# Patient Record
Sex: Female | Born: 1995 | Race: Black or African American | Hispanic: No | Marital: Single | Smoking: Never smoker
Health system: Southern US, Community
[De-identification: ages and names within clinical notes are randomized; demographics above are authoritative.]

## PROBLEM LIST (undated history)

## (undated) DIAGNOSIS — Z789 Other specified health status: Secondary | ICD-10-CM

## (undated) DIAGNOSIS — O1490 Unspecified pre-eclampsia, unspecified trimester: Secondary | ICD-10-CM

## (undated) HISTORY — PX: NO PAST SURGERIES: SHX2092

---
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
## 2015-06-22 ENCOUNTER — Emergency Department (HOSPITAL_COMMUNITY)
Admission: EM | Admit: 2015-06-22 | Discharge: 2015-06-22 | Disposition: A | Discharge status: Home / Self Care | Payer: BLUE CROSS/BLUE SHIELD | Attending: Emergency Medicine | Admitting: Emergency Medicine

## 2015-06-22 ENCOUNTER — Encounter (HOSPITAL_COMMUNITY): Payer: Self-pay | Admitting: Oncology

## 2015-06-22 DIAGNOSIS — L03116 Cellulitis of left lower limb: Secondary | ICD-10-CM | POA: Insufficient documentation

## 2015-06-22 DIAGNOSIS — R2242 Localized swelling, mass and lump, left lower limb: Secondary | ICD-10-CM | POA: Diagnosis present

## 2015-06-22 MED ORDER — DOXYCYCLINE HYCLATE 100 MG PO CAPS: 100.0000 mg | ORAL_CAPSULE | Freq: Two times a day (BID) | ORAL | Status: DC

## 2015-06-22 MED ORDER — CEPHALEXIN 500 MG PO CAPS: 500.0000 mg | ORAL_CAPSULE | Freq: Four times a day (QID) | ORAL | Status: DC

## 2015-06-22 NOTE — ED Notes (Signed)
Pt was in [place] and got a fire ant bite was seen there and given 2 shots.  Pt has an open area on left medial calf w/ purulent drainage site is warm and red.  Pain is 10/10.

## 2015-06-22 NOTE — ED Provider Notes (Signed)
CSN: [id number]   Arrival date & time 06/22/15 0004  History  This chart was scribed for  Derwood Kaplan, MD by Bethel Born, ED Scribe. This patient was seen in room WA25/WA25 and the patient's care was started at 2:28 AM.  Chief Complaint  Patient presents with  . Insect Bite    HPI The history is provided by the patient and a parent. No language interpreter was used.   Marisa Mueller is a 19 y.o. female who presents to the Emergency Department complaining of a suspected insect bite at the left calf with onset 3-4 weeks ago. She believes that she was bitten by a fire ant when she visited [place] the 2 week in July but is not sure. There was a bump that she tried to open by squeezing. At that time she was treated in the ED and given prescriptions that she was unable to fill. Since then the swelling has worsened, the pain has increased, and she has noted some yellow/purulent drainage after squeezing the area. No fever or chills.    History reviewed. No pertinent past medical history.  History reviewed. No pertinent past surgical history.  No family history on file.  Social History  Substance Use Topics  . Smoking status: Passive Smoke Exposure - Never Smoker  . Smokeless tobacco: None  . Alcohol Use: No     Review of Systems  Constitutional: Negative for fever and chills.  Musculoskeletal:       An area of pain and swelling at the left leg with purulent drainage     Home Medications   Prior to Admission medications   Medication Sig Start Date End Date Taking? Authorizing Provider  acetaminophen (TYLENOL) 500 MG tablet Take 1,000 mg by mouth every 6 (six) hours as needed for headache.   Yes Historical Provider, MD  cephALEXin (KEFLEX) 500 MG capsule Take 1 capsule (500 mg total) by mouth 4 (four) times daily. 06/22/15   Derwood Kaplan, MD  doxycycline (VIBRAMYCIN) 100 MG capsule Take 1 capsule (100 mg total) by mouth 2 (two) times daily. 06/22/15   Derwood Kaplan, MD     Allergies  Review of patient's allergies indicates no known allergies.  Triage Vitals: BP 136/79 mmHg  Pulse 100  Temp(Src) 99.4 F (37.4 C) (Oral)  Resp 18  Ht  (1.575 m)  Wt 107 lb (48.535 kg)  BMI 19.57 kg/m2  SpO2 100%  LMP 06/03/2015  Physical Exam  Constitutional: She is oriented to person, place, and time. She appears well-developed and well-nourished.  HENT:  Head: Normocephalic.  Eyes: EOM are normal.  Neck: Normal range of motion.  Pulmonary/Chest: Effort normal.  Abdominal: She exhibits no distension.  Musculoskeletal: Normal range of motion.  Left leg- 4 cm area of erythematous and edematous lesion with central crusting and +blanching No abscess drainage to palpation +Mild fluctuance  Neurological: She is alert and oriented to person, place, and time.  Psychiatric: She has a normal mood and affect.  Nursing note and vitals reviewed.   ED Course  Procedures   DIAGNOSTIC STUDIES: Oxygen Saturation is 100% on RA, normal by my interpretation.    COORDINATION OF CARE: 2:33 AM Discussed treatment plan with pt at bedside and pt agreed to plan.  Labs Review- Labs Reviewed - No data to display  Imaging Review No results found.  EKG Interpretation None      MDM   Final diagnoses:  Cellulitis of left lower extremity     I personally performed  the services described in this documentation, which was scribed in my presence. The recorded information has been reviewed and is accurate.  Pt has a lesion that appears to be infected. She had drained the abscess herself - and i dont have a noticeable area of fluctuance that needs to be drained. Will start her on antibiotics.    Derwood Kaplan, MD 06/22/15 [phone]

## 2015-06-22 NOTE — Discharge Instructions (Signed)

## 2015-09-08 ENCOUNTER — Encounter (HOSPITAL_BASED_OUTPATIENT_CLINIC_OR_DEPARTMENT_OTHER): Payer: Self-pay | Admitting: *Deleted

## 2015-09-08 ENCOUNTER — Emergency Department (HOSPITAL_BASED_OUTPATIENT_CLINIC_OR_DEPARTMENT_OTHER)
Admission: EM | Admit: 2015-09-08 | Discharge: 2015-09-08 | Disposition: A | Discharge status: Home / Self Care | Payer: BLUE CROSS/BLUE SHIELD | Attending: Emergency Medicine | Admitting: Emergency Medicine

## 2015-09-08 ENCOUNTER — Emergency Department (HOSPITAL_BASED_OUTPATIENT_CLINIC_OR_DEPARTMENT_OTHER): Payer: BLUE CROSS/BLUE SHIELD

## 2015-09-08 DIAGNOSIS — O9989 Other specified diseases and conditions complicating pregnancy, childbirth and the puerperium: Secondary | ICD-10-CM | POA: Diagnosis present

## 2015-09-08 DIAGNOSIS — N390 Urinary tract infection, site not specified: Secondary | ICD-10-CM

## 2015-09-08 DIAGNOSIS — Z3A01 Less than 8 weeks gestation of pregnancy: Secondary | ICD-10-CM | POA: Insufficient documentation

## 2015-09-08 DIAGNOSIS — O26891 Other specified pregnancy related conditions, first trimester: Secondary | ICD-10-CM

## 2015-09-08 DIAGNOSIS — O2341 Unspecified infection of urinary tract in pregnancy, first trimester: Secondary | ICD-10-CM | POA: Insufficient documentation

## 2015-09-08 DIAGNOSIS — R109 Unspecified abdominal pain: Secondary | ICD-10-CM

## 2015-09-08 DIAGNOSIS — Z3490 Encounter for supervision of normal pregnancy, unspecified, unspecified trimester: Secondary | ICD-10-CM

## 2015-09-08 DIAGNOSIS — R102 Pelvic and perineal pain: Secondary | ICD-10-CM

## 2015-09-08 LAB — URINALYSIS, ROUTINE W REFLEX MICROSCOPIC
Bilirubin Urine: NEGATIVE
GLUCOSE, UA: NEGATIVE mg/dL
Ketones, ur: >80 mg/dL — AB
Nitrite: POSITIVE — AB
PH: 6.5 (ref 5.0–8.0)
Protein, ur: NEGATIVE mg/dL
Specific Gravity, Urine: 1.025 (ref 1.005–1.030)
Urobilinogen, UA: 1 mg/dL (ref 0.0–1.0)

## 2015-09-08 LAB — URINE MICROSCOPIC-ADD ON

## 2015-09-08 LAB — HCG, QUANTITATIVE, PREGNANCY: HCG, BETA CHAIN, QUANT, S: 88660 m[IU]/mL — AB (ref <5)

## 2015-09-08 LAB — ABO/RH: ABO/RH(D): O POS

## 2015-09-08 LAB — PREGNANCY, URINE: Preg Test, Ur: POSITIVE — AB

## 2015-09-08 MED ORDER — CEPHALEXIN 500 MG PO CAPS: 500.0000 mg | ORAL_CAPSULE | Freq: Four times a day (QID) | ORAL | Status: DC

## 2015-09-08 MED ORDER — METOCLOPRAMIDE HCL 10 MG PO TABS: 10.0000 mg | ORAL_TABLET | Freq: Four times a day (QID) | ORAL | Status: AC

## 2015-09-08 MED ORDER — SODIUM CHLORIDE 0.9 % IV BOLUS (SEPSIS)
1000.0000 mL | Freq: Once | INTRAVENOUS | Status: AC
  Administered 2015-09-08: 1000 mL via INTRAVENOUS

## 2015-09-08 NOTE — Discharge Instructions (Signed)
You were evaluated in the ED today for your abdominal discomfort. You were found to have a UTI and you are also pregnant. Your ultrasound showed approximately 266 week old gestation Please follow-up with women's center and your primary care doctor for definitive care. Please take her antibiotic as prescribed to help with your urinary tract infection. You may take your Reglan for nausea. Return to ED for worsening symptoms.  Abdominal Pain During Pregnancy Belly (abdominal) pain is common during pregnancy. Most of the time, it is not a serious problem. Other times, it can be a sign that something is wrong with the pregnancy. Always tell your doctor if you have belly pain. HOME CARE Monitor your belly pain for any changes. The following actions may help you feel better:  Do not have sex (intercourse) or put anything in your vagina until you feel better.  Rest until your pain stops.  Drink clear fluids if you feel sick to your stomach (nauseous). Do not eat solid food until you feel better.  Only take medicine as told by your doctor.  Keep all doctor visits as told. GET HELP RIGHT AWAY IF:   You are bleeding, leaking fluid, or pieces of tissue come out of your vagina.  You have more pain or cramping.  You keep throwing up (vomiting).  You have pain when you pee (urinate) or have blood in your pee.  You have a fever.  You do not feel your baby moving as much.  You feel very weak or feel like passing out.  You have trouble breathing, with or without belly pain.  You have a very bad headache and belly pain.  You have fluid leaking from your vagina and belly pain.  You keep having watery poop (diarrhea).  Your belly pain does not go away after resting, or the pain gets worse. MAKE SURE YOU:   Understand these instructions.  Will watch your condition.  Will get help right away if you are not doing well or get worse.   This information is not intended to replace advice given to  you by your health care provider. Make sure you discuss any questions you have with your health care provider.   Document Released: 10/13/2009 Document Revised: 06/27/2013 Document Reviewed: 05/24/2013 Elsevier Interactive Patient Education [employer]2016 Elsevier Inc.

## 2015-09-08 NOTE — ED Notes (Addendum)
Pt c/o lower abd pain x 1 week with nausea, positive preg

## 2015-09-08 NOTE — ED Provider Notes (Signed)
CSN: [id number]     Arrival date & time 09/08/15  1608 History   First MD Initiated Contact with Patient 09/08/15 1625     Chief Complaint  Patient presents with  . Abdominal Pain     (Consider location/radiation/quality/duration/timing/severity/associated sxs/prior Treatment) HPI Marisa Mueller is a 19 y.o. female who comes in for evaluation of lower abdominal discomfort. Patient states she has had lower abdominal, frontal cramping since Monday. She reports taking 3 home pregnancy tests at home which were all positive. She is accompanied by family at bedside who states she is here today for proof of pregnancy for her to be seen at health department/Medicaid office. They also expressed concern for possible ectopic. Patient has had no prior pregnancies. She reports associated nausea and vomiting, nonbloody and nonbilious. She reports her last menstrual period was last month in the middle of the month but is unsure exactly time. She reports associated discharge for the past 2 weeks but no vaginal bleeding. She reports decreased urine frequency since Monday. No other back pain, fevers or chills. No other abdominal surgeries. Denies diarrhea or constipation. Rates her overall discomfort as 6/10. No other aggravating or modifying factors.  History reviewed. No pertinent past medical history. History reviewed. No pertinent past surgical history. History reviewed. No pertinent family history. Social History  Substance Use Topics  . Smoking status: Passive Smoke Exposure - Never Smoker  . Smokeless tobacco: None  . Alcohol Use: No   OB History    No data available     Review of Systems A 10 point review of systems was completed and was negative except for pertinent positives and negatives as mentioned in the history of present illness     Allergies  Review of patient's allergies indicates no known allergies.  Home Medications   Prior to Admission medications   Medication Sig Start Date End  Date Taking? Authorizing Provider  cephALEXin (KEFLEX) 500 MG capsule Take 1 capsule (500 mg total) by mouth 4 (four) times daily. 09/08/15   Joycie Peek, PA-C  metoCLOPramide (REGLAN) 10 MG tablet Take 1 tablet (10 mg total) by mouth every 6 (six) hours. 09/08/15   Ondra Deboard, PA-C   BP 107/55 mmHg  Pulse 93  Temp(Src) 98.6 F (37 C) (Oral)  Resp 16  Ht  (1.575 m)  Wt 107 lb (48.535 kg)  BMI 19.57 kg/m2  SpO2 100%  LMP 07/23/2015 Physical Exam  Constitutional: She is oriented to person, place, and time. She appears well-developed and well-nourished.  HENT:  Head: Normocephalic and atraumatic.  Mouth/Throat: Oropharynx is clear and moist.  Eyes: Conjunctivae are normal. Pupils are equal, round, and reactive to light. Right eye exhibits no discharge. Left eye exhibits no discharge. No scleral icterus.  Neck: Neck supple.  Cardiovascular: Normal rate, regular rhythm and normal heart sounds.   Pulmonary/Chest: Effort normal and breath sounds normal. No respiratory distress. She has no wheezes. She has no rales.  Abdominal: Soft. There is no tenderness.  Abdomen is soft, nondistended. Mild Tenderness in the superpubic region with palpation. No other focal abdominal tenderness. No rebound or guarding. No CVA tenderness. No other lesions or deformities.  Musculoskeletal: She exhibits no tenderness.  Neurological: She is alert and oriented to person, place, and time.  Cranial Nerves II-XII grossly intact  Skin: Skin is warm and dry. No rash noted.  Psychiatric: She has a normal mood and affect.  Nursing note and vitals reviewed.   ED Course  Procedures (including critical care  time) Labs Review Labs Reviewed  PREGNANCY, URINE - Abnormal; Notable for the following:    Preg Test, Ur POSITIVE (*)    All other components within normal limits  URINALYSIS, ROUTINE W REFLEX MICROSCOPIC (NOT AT [facility]) - Abnormal; Notable for the following:    Color, Urine AMBER (*)     APPearance CLOUDY (*)    Hgb urine dipstick TRACE (*)    Ketones, ur >80 (*)    Nitrite POSITIVE (*)    Leukocytes, UA LARGE (*)    All other components within normal limits  URINE MICROSCOPIC-ADD ON - Abnormal; Notable for the following:    Squamous Epithelial / LPF FEW (*)    Bacteria, UA MANY (*)    All other components within normal limits  HCG, QUANTITATIVE, PREGNANCY - Abnormal; Notable for the following:    hCG, Beta Chain, Sharene Butters, [place] [address] (*)    All other components within normal limits  URINE CULTURE  ABO/RH    Imaging Review US Ob Comp Less 14 Wks  09/08/2015  CLINICAL DATA:  Initial encounter for 1 week history of abdominal pain with positive pregnancy test. EXAM: OBSTETRIC <14 WK [place] AND TRANSVAGINAL OB US TECHNIQUE: Both transabdominal and transvaginal ultrasound examinations were performed for complete evaluation of the gestation as well as the maternal uterus, adnexal regions, and pelvic cul-de-sac. Transvaginal technique was performed to assess early pregnancy. COMPARISON:  None. FINDINGS: Intrauterine gestational sac: Visualized/normal in shape. Yolk sac:  Visualized Embryo:  Visualized Cardiac Activity: Visualized Heart Rate: 106  bpm CRL:  5  mm   6 w   2 d                  [place] EDC: 04/30/2015. Maternal uterus/adnexae: Maternal ovaries are normal in appearance. No evidence for subchorionic hemorrhage. No free fluid in the cul-de-sac. IMPRESSION: Single living intrauterine gestation at estimated 6 week 2 day gestational age by crown-rump length. Electronically Signed   By: Kennith Center M.D.   On: 09/08/2015 18:32   US Ob Transvaginal  09/08/2015  CLINICAL DATA:  Initial encounter for 1 week history of abdominal pain with positive pregnancy test. EXAM: OBSTETRIC <14 WK [place] AND TRANSVAGINAL OB US TECHNIQUE: Both transabdominal and transvaginal ultrasound examinations were performed for complete evaluation of the gestation as well as the maternal uterus, adnexal regions, and  pelvic cul-de-sac. Transvaginal technique was performed to assess early pregnancy. COMPARISON:  None. FINDINGS: Intrauterine gestational sac: Visualized/normal in shape. Yolk sac:  Visualized Embryo:  Visualized Cardiac Activity: Visualized Heart Rate: 106  bpm CRL:  5  mm   6 w   2 d                  [place] EDC: 04/30/2015. Maternal uterus/adnexae: Maternal ovaries are normal in appearance. No evidence for subchorionic hemorrhage. No free fluid in the cul-de-sac. IMPRESSION: Single living intrauterine gestation at estimated 6 week 2 day gestational age by crown-rump length. Electronically Signed   By: Kennith Center M.D.   On: 09/08/2015 18:32   I have personally reviewed and evaluated these images and lab results as part of my medical decision-making.   EKG Interpretation None     Meds given in ED:  Medications  sodium chloride 0.9 % bolus 1,000 mL (0 mLs Intravenous Stopped 09/08/15 1846)    Discharge Medication List as of 09/08/2015  8:34 PM    START taking these medications   Details  cephALEXin (KEFLEX) 500 MG capsule Take 1 capsule (  500 mg total) by mouth 4 (four) times daily., Starting 09/08/2015, Until Discontinued, Print    metoCLOPramide (REGLAN) 10 MG tablet Take 1 tablet (10 mg total) by mouth every 6 (six) hours., Starting 09/08/2015, Until Discontinued, Print       Filed Vitals:   09/08/15 1611 09/08/15 1855 09/08/15 2126  BP: 124/72 95/54 107/55  Pulse: 79 89 93  Temp: 98.3 F (36.8 C) 98.9 F (37.2 C) 98.6 F (37 C)  TempSrc: Oral Oral Oral  Resp: 18 18 16   Height: 5\' 2"  (1.575 m)    Weight: 107 lb (48.535 kg)    SpO2: 94% 100% 100%    MDM  Stasia CavalierSabiha Welton FlakesKhan is a 19 y.o. female G1 P0, comes in for evaluation of abdominal cramping. Patient found to be pregnant with single IUP via ultrasound. No evidence of ectopic. Also found to have UTI. Will obtain urine culture and treat with Keflex. Positive urine pregnancy test, beta hCG 80,000, ABO/Rh obtained-O+. Abdominal exam is  not impressive, very mild tenderness over suprapubic region consistent with UTI. Abdomen is otherwise benign. Patient given specific follow-up instructions and for definitive prenatal care with women's center. Also given referral for primary care. I discussed all imaging and labs with patient and was available for any and all questioning. Patient expresses none. Overall, patient appears well, nontoxic, is hemodynamically stable with normal vital signs and is appropriate for discharge. No evidence of other acute or emergent pathology at this time. Final diagnoses:  Abdominal discomfort  UTI (lower urinary tract infection)  Pregnancy        Joycie PeekBenjamin Jakorey Mcconathy, PA-C 09/09/15 1508  Tilden FossaElizabeth Rees, MD 09/18/15 [phone]

## 2015-09-08 NOTE — ED Notes (Signed)
pts boyfriends mother states she broughther here for a piece of paper that shows positive preg so she can get her medicaid and is concern for etopic preg.

## 2015-09-08 NOTE — ED Notes (Signed)
Pa  at bedside. 

## 2015-09-11 LAB — URINE CULTURE

## 2015-09-12 ENCOUNTER — Telehealth (HOSPITAL_COMMUNITY): Payer: Self-pay

## 2015-09-12 NOTE — Telephone Encounter (Signed)
Post ED Visit - Positive Culture Follow-up  Culture report reviewed by antimicrobial stewardship pharmacist:  []  Enzo BiNathan Batchelder, Pharm.D. []  Celedonio MiyamotoJeremy Frens, Pharm.D., BCPS []  Garvin FilaMike Maccia, Pharm.D. []  Georgina PillionElizabeth Martin, Pharm.D., BCPS []  LebanonMinh Pham, [address]Ph[address]D., BCPS, AAHIVP []  Estella HuskMichelle Turner, Pharm.D., BCPS, AAHIVP []  Tennis Mustassie Stewart, Pharm.D. [x]  Sherle Poeob Vincent, [address]Pharm[address]  Positive urine culture, >/= 100,000 colonies -> E Coli Treated with Cephalexin, organism sensitive to the same and no further patient follow-up is required at this time.  Marisa Mueller, Marisa Mueller 09/12/2015, 9:36 AM

## 2015-10-17 ENCOUNTER — Inpatient Hospital Stay (HOSPITAL_COMMUNITY)
Admission: AD | Admit: 2015-10-17 | Discharge: 2015-10-18 | Disposition: A | Discharge status: Home / Self Care | Payer: BLUE CROSS/BLUE SHIELD | Source: Ambulatory Visit | Attending: Obstetrics & Gynecology | Admitting: Obstetrics & Gynecology

## 2015-10-17 ENCOUNTER — Encounter (HOSPITAL_COMMUNITY): Payer: Self-pay | Admitting: *Deleted

## 2015-10-17 DIAGNOSIS — M545 Low back pain: Secondary | ICD-10-CM | POA: Insufficient documentation

## 2015-10-17 DIAGNOSIS — O26891 Other specified pregnancy related conditions, first trimester: Secondary | ICD-10-CM | POA: Diagnosis not present

## 2015-10-17 DIAGNOSIS — M549 Dorsalgia, unspecified: Secondary | ICD-10-CM | POA: Diagnosis not present

## 2015-10-17 DIAGNOSIS — Z3A11 11 weeks gestation of pregnancy: Secondary | ICD-10-CM | POA: Insufficient documentation

## 2015-10-17 DIAGNOSIS — O99891 Other specified diseases and conditions complicating pregnancy: Secondary | ICD-10-CM

## 2015-10-17 DIAGNOSIS — O9989 Other specified diseases and conditions complicating pregnancy, childbirth and the puerperium: Secondary | ICD-10-CM

## 2015-10-17 DIAGNOSIS — O2341 Unspecified infection of urinary tract in pregnancy, first trimester: Secondary | ICD-10-CM | POA: Insufficient documentation

## 2015-10-17 HISTORY — DX: Other specified health status: Z78.9

## 2015-10-17 LAB — URINALYSIS, ROUTINE W REFLEX MICROSCOPIC
BILIRUBIN URINE: NEGATIVE
Glucose, UA: NEGATIVE mg/dL
Hgb urine dipstick: NEGATIVE
KETONES UR: NEGATIVE mg/dL
Nitrite: NEGATIVE
Protein, ur: NEGATIVE mg/dL
Specific Gravity, Urine: 1.01 (ref 1.005–1.030)
pH: 7 (ref 5.0–8.0)

## 2015-10-17 LAB — URINE MICROSCOPIC-ADD ON

## 2015-10-17 NOTE — MAU Provider Note (Signed)
History     CSN: [id number]  Arrival date and time: 10/17/15 2222   First Provider Initiated Contact with Patient 10/17/15 2351      Chief Complaint  Patient presents with  . Back Pain   HPI   [name] is a 19 y.o. female G1P0 at [redacted]w[redacted]d presenting to MAU with complaints of lower back pain, both sides. The pain started it 1 week ago; the pain worsened today. She has never had this pain before. The pain comes and goes and worsens when she walks. She is on her feet a lot for school, however she does get rests to sit. She feels she is wearing a good pair of shoes.   She has not injured her back.   Denies vaginal bleeding.   She is scheduled to see [facility] on Tuesday for her first OB visit.   OB History    Gravida Para Term Preterm AB TAB SAB Ectopic Multiple Living   1         0      Past Medical History  Diagnosis Date  . Medical history non-contributory     Past Surgical History  Procedure Laterality Date  . No past surgeries      Family History  Problem Relation Age of Onset  . Cancer Paternal Grandmother   . Cancer Paternal Grandfather     Social History  Substance Use Topics  . Smoking status: Passive Smoke Exposure - Never Smoker  . Smokeless tobacco: None  . Alcohol Use: No    Allergies: No Known Allergies  Prescriptions prior to admission  Medication Sig Dispense Refill Last Dose  . cephALEXin (KEFLEX) 500 MG capsule Take 1 capsule (500 mg total) by mouth 4 (four) times daily. 40 capsule 0   . metoCLOPramide (REGLAN) 10 MG tablet Take 1 tablet (10 mg total) by mouth every 6 (six) hours. 30 tablet 0    Results for orders placed or performed during the hospital encounter of 10/17/15 (from the past 48 hour(s))  Urinalysis, Routine w reflex microscopic (not at [facility])     Status: Abnormal   Collection Time: 10/17/15 11:05 PM  Result Value Ref Range   Color, Urine YELLOW YELLOW   APPearance CLEAR CLEAR   Specific Gravity, Urine 1.010  1.005 - 1.030   pH 7.0 5.0 - 8.0   Glucose, UA NEGATIVE NEGATIVE mg/dL   Hgb urine dipstick NEGATIVE NEGATIVE   Bilirubin Urine NEGATIVE NEGATIVE   Ketones, ur NEGATIVE NEGATIVE mg/dL   Protein, ur NEGATIVE NEGATIVE mg/dL   Nitrite NEGATIVE NEGATIVE   Leukocytes, UA TRACE (A) NEGATIVE  Urine microscopic-add on     Status: Abnormal   Collection Time: 10/17/15 11:05 PM  Result Value Ref Range   Squamous Epithelial / LPF 0-5 (A) NONE SEEN   WBC, UA 0-5 0 - 5 WBC/hpf   RBC / HPF 0-5 0 - 5 RBC/hpf   Bacteria, UA FEW (A) NONE SEEN    Review of Systems  Constitutional: Positive for chills. Negative for fever.  Gastrointestinal: Negative for nausea, vomiting, abdominal pain, diarrhea and constipation.  Genitourinary: Positive for dysuria, urgency and frequency. Negative for hematuria.  Musculoskeletal: Positive for back pain.   Physical Exam   Blood pressure 101/54, pulse 86, temperature 98.4 F (36.9 C), resp. rate 18, height  (1.575 m), weight 102 lb (46.267 kg), last menstrual period 07/23/2015.  Physical Exam  Constitutional: She is oriented to person, place, and time. She appears well-developed and  well-nourished. No distress.  Respiratory: Effort normal.  GI: Normal appearance. There is no CVA tenderness.  Musculoskeletal: Normal range of motion.  Neurological: She is alert and oriented to person, place, and time.  Skin: Skin is warm. She is not diaphoretic.  Psychiatric: Her behavior is normal.    MAU Course  Procedures  None  MDM  + fetal heart tones by doppler.  Urine culture pending   Assessment and Plan   A:  1. Back pain affecting pregnancy in first trimester   2. UTI in pregnancy, first trimester    P:  Discharge home in stable condition Urine culture pending RX: Keflex elixir (patient requests) Keep appointment with [facility] Return to MAU if symptoms worsen   Duane LopeJennifer I Rasch, NP 10/18/2015 12:46 AM

## 2015-10-17 NOTE — MAU Note (Signed)
Pain in lower back and both lower sides for a wk. Worse tonight, esp when walking. Denies bleeding. Small amt creamy vag d/c

## 2015-10-18 DIAGNOSIS — O26891 Other specified pregnancy related conditions, first trimester: Secondary | ICD-10-CM

## 2015-10-18 DIAGNOSIS — M549 Dorsalgia, unspecified: Secondary | ICD-10-CM

## 2015-10-18 DIAGNOSIS — O2341 Unspecified infection of urinary tract in pregnancy, first trimester: Secondary | ICD-10-CM | POA: Diagnosis not present

## 2015-10-18 MED ORDER — CEPHALEXIN 250 MG/5ML PO SUSR: 500.0000 mg | Freq: Once | ORAL | Status: DC

## 2015-10-18 MED ORDER — CEPHALEXIN 250 MG/5ML PO SUSR: 500.0000 mg | Freq: Four times a day (QID) | ORAL | Status: AC

## 2015-10-18 NOTE — Discharge Instructions (Signed)
Back Exercises If you have pain in your back, do these exercises 2-3 times each day or as told by your doctor. When the pain goes away, do the exercises once each day, but repeat the steps more times for each exercise (do more repetitions). If you do not have pain in your back, do these exercises once each day or as told by your doctor. EXERCISES Single Knee to Chest Do these steps 3-5 times in a row for each leg:  Lie on your back on a firm bed or the floor with your legs stretched out.  Bring one knee to your chest.  Hold your knee to your chest by grabbing your knee or thigh.  Pull on your knee until you feel a gentle stretch in your lower back.  Keep doing the stretch for 10-30 seconds.  Slowly let go of your leg and straighten it. Pelvic Tilt Do these steps 5-10 times in a row:  Lie on your back on a firm bed or the floor with your legs stretched out.  Bend your knees so they point up to the ceiling. Your feet should be flat on the floor.  Tighten your lower belly (abdomen) muscles to press your lower back against the floor. This will make your tailbone point up to the ceiling instead of pointing down to your feet or the floor.  Stay in this position for 5-10 seconds while you gently tighten your muscles and breathe evenly. Cat-Cow Do these steps until your lower back bends more easily:  Get on your hands and knees on a firm surface. Keep your hands under your shoulders, and keep your knees under your hips. You may put padding under your knees.  Let your head hang down, and make your tailbone point down to the floor so your lower back is round like the back of a cat.  Stay in this position for 5 seconds.  Slowly lift your head and make your tailbone point up to the ceiling so your back hangs low (sags) like the back of a cow.  Stay in this position for 5 seconds. Press-Ups Do these steps 5-10 times in a row:  Lie on your belly (face-down) on the floor.  Place your  hands near your head, about shoulder-width apart.  While you keep your back relaxed and keep your hips on the floor, slowly straighten your arms to raise the top half of your body and lift your shoulders. Do not use your back muscles. To make yourself more comfortable, you may change where you place your hands.  Stay in this position for 5 seconds.  Slowly return to lying flat on the floor. Bridges Do these steps 10 times in a row:  Lie on your back on a firm surface.  Bend your knees so they point up to the ceiling. Your feet should be flat on the floor.  Tighten your butt muscles and lift your butt off of the floor until your waist is almost as high as your knees. If you do not feel the muscles working in your butt and the back of your thighs, slide your feet 1-2 inches farther away from your butt.  Stay in this position for 3-5 seconds.  Slowly lower your butt to the floor, and let your butt muscles relax. If this exercise is too easy, try doing it with your arms crossed over your chest. Belly Crunches Do these steps 5-10 times in a row:  Lie on your back on a firm bed  or the floor with your legs stretched out.  Bend your knees so they point up to the ceiling. Your feet should be flat on the floor.  Cross your arms over your chest.  Tip your chin a little bit toward your chest but do not bend your neck.  Tighten your belly muscles and slowly raise your chest just enough to lift your shoulder blades a tiny bit off of the floor.  Slowly lower your chest and your head to the floor. Back Lifts Do these steps 5-10 times in a row:  Lie on your belly (face-down) with your arms at your sides, and rest your forehead on the floor.  Tighten the muscles in your legs and your butt.  Slowly lift your chest off of the floor while you keep your hips on the floor. Keep the back of your head in line with the curve in your back. Look at the floor while you do this.  Stay in this position  for 3-5 seconds.  Slowly lower your chest and your face to the floor. GET HELP IF:  Your back pain gets a lot worse when you do an exercise.  Your back pain does not lessen 2 hours after you exercise. If you have any of these problems, stop doing the exercises. Do not do them again unless your doctor says it is okay. GET HELP RIGHT AWAY IF:  You have sudden, very bad back pain. If this happens, stop doing the exercises. Do not do them again unless your doctor says it is okay.   This information is not intended to replace advice given to you by your health care provider. Make sure you discuss any questions you have with your health care provider.   Document Released: 11/27/2010 Document Revised: 07/16/2015 Document Reviewed: 12/19/2014 Elsevier Interactive Patient Education 2016 Else[employer] Inc[employer] Pregnancy and Urinary Tract Infection A urinary tract infection (UTI) is a bacterial infection of the urinary tract. Infection of the urinary tract can include the ureters, kidneys (pyelonephritis), bladder (cystitis), and urethra (urethritis). All pregnant women should be screened for bacteria in the urinary tract. Identifying and treating a UTI will decrease the risk of preterm labor and developing more serious infections in both the mother and baby. CAUSES Bacteria germs cause almost all UTIs.  RISK FACTORS Many factors can increase your chances of getting a UTI during pregnancy. These include:  Having a short urethra.  Poor toilet and hygiene habits.  Sexual intercourse.  Blockage of urine along the urinary tract.  Problems with the pelvic muscles or nerves.  Diabetes.  Obesity.  Bladder problems after having several children.  Previous history of UTI. SIGNS AND SYMPTOMS   Pain, burning, or a stinging feeling when urinating.  Suddenly feeling the need to urinate right away (urgency).  Loss of bladder control (urinary incontinence).  Frequent urination, more than is common  with pregnancy.  Lower abdominal or back discomfort.  Cloudy urine.  Blood in the urine (hematuria).  Fever. When the kidneys are infected, the symptoms may be:  Back pain.  Flank pain on the right side more so than the left.  Fever.  Chills.  Nausea.  Vomiting. DIAGNOSIS  A urinary tract infection is usually diagnosed through urine tests. Additional tests and procedures are sometimes done. These may include:  Ultrasound exam of the kidneys, ureters, bladder, and urethra.  Looking in the bladder with a lighted tube (cystoscopy). TREATMENT Typically, UTIs can be treated with antibiotic medicines.  HOME CARE INSTRUCTIONS  Only take over-the-counter or prescription medicines as directed by your health care provider. If you were prescribed antibiotics, take them as directed. Finish them even if you start to feel better.  Drink enough fluids to keep your urine clear or pale yellow.  Do not have sexual intercourse until the infection is gone and your health care provider says it is okay.  Make sure you are tested for UTIs throughout your pregnancy. These infections often come back. Preventing a UTI in the Future  Practice good toilet habits. Always wipe from front to back. Use the tissue only once.  Do not hold your urine. Empty your bladder as soon as possible when the urge comes.  Do not douche or use deodorant sprays.  Wash with soap and warm water around the genital area and the anus.  Empty your bladder before and after sexual intercourse.  Wear underwear with a cotton crotch.  Avoid caffeine and carbonated drinks. They can irritate the bladder.  Drink cranberry juice or take cranberry pills. This may decrease the risk of getting a UTI.  Do not drink alcohol.  Keep all your appointments and tests as scheduled. SEEK MEDICAL CARE IF:   Your symptoms get worse.  You are still having fevers 2 or more days after treatment begins.  You have a  rash.  You feel that you are having problems with medicines prescribed.  You have abnormal vaginal discharge. SEEK IMMEDIATE MEDICAL CARE IF:   You have back or flank pain.  You have chills.  You have blood in your urine.  You have nausea and vomiting.  You have contractions of your uterus.  You have a gush of fluid from the vagina. MAKE SURE YOU:  Understand these instructions.   Will watch your condition.   Will get help right away if you are not doing well or get worse.    This information is not intended to replace advice given to you by your health care provider. Make sure you discuss any questions you have with your health care provider.   Document Released: 02/19/2011 Document Revised: 08/15/2013 Document Reviewed: 05/24/2013 Elsevier Interactive Patient Education [employer]

## 2015-10-18 NOTE — Progress Notes (Signed)
Written and verbal d/c instructions given and understanding voiced. 

## 2015-10-20 LAB — CULTURE, OB URINE
Culture: >=100000
Special Requests: NORMAL

## 2016-04-23 DIAGNOSIS — O1490 Unspecified pre-eclampsia, unspecified trimester: Secondary | ICD-10-CM

## 2016-04-23 HISTORY — DX: Unspecified pre-eclampsia, unspecified trimester: O14.90

## 2016-08-21 ENCOUNTER — Encounter (HOSPITAL_COMMUNITY): Payer: Self-pay

## 2017-10-13 ENCOUNTER — Emergency Department (HOSPITAL_BASED_OUTPATIENT_CLINIC_OR_DEPARTMENT_OTHER)
Admission: EM | Admit: 2017-10-13 | Discharge: 2017-10-13 | Disposition: A | Discharge status: Home / Self Care | Payer: Medicaid Other | Attending: Emergency Medicine | Admitting: Emergency Medicine

## 2017-10-13 ENCOUNTER — Encounter (HOSPITAL_BASED_OUTPATIENT_CLINIC_OR_DEPARTMENT_OTHER): Payer: Self-pay | Admitting: *Deleted

## 2017-10-13 ENCOUNTER — Emergency Department (HOSPITAL_BASED_OUTPATIENT_CLINIC_OR_DEPARTMENT_OTHER): Payer: Medicaid Other

## 2017-10-13 ENCOUNTER — Other Ambulatory Visit: Payer: Self-pay

## 2017-10-13 DIAGNOSIS — O468X1 Other antepartum hemorrhage, first trimester: Secondary | ICD-10-CM

## 2017-10-13 DIAGNOSIS — O219 Vomiting of pregnancy, unspecified: Secondary | ICD-10-CM | POA: Insufficient documentation

## 2017-10-13 DIAGNOSIS — Z7722 Contact with and (suspected) exposure to environmental tobacco smoke (acute) (chronic): Secondary | ICD-10-CM | POA: Diagnosis not present

## 2017-10-13 DIAGNOSIS — O418X10 Other specified disorders of amniotic fluid and membranes, first trimester, not applicable or unspecified: Secondary | ICD-10-CM

## 2017-10-13 DIAGNOSIS — Z3A01 Less than 8 weeks gestation of pregnancy: Secondary | ICD-10-CM | POA: Diagnosis not present

## 2017-10-13 DIAGNOSIS — N898 Other specified noninflammatory disorders of vagina: Secondary | ICD-10-CM | POA: Diagnosis not present

## 2017-10-13 DIAGNOSIS — O418X11 Other specified disorders of amniotic fluid and membranes, first trimester, fetus 1: Secondary | ICD-10-CM | POA: Insufficient documentation

## 2017-10-13 DIAGNOSIS — R102 Pelvic and perineal pain: Secondary | ICD-10-CM | POA: Insufficient documentation

## 2017-10-13 DIAGNOSIS — Z8759 Personal history of other complications of pregnancy, childbirth and the puerperium: Secondary | ICD-10-CM | POA: Insufficient documentation

## 2017-10-13 DIAGNOSIS — O9989 Other specified diseases and conditions complicating pregnancy, childbirth and the puerperium: Secondary | ICD-10-CM | POA: Diagnosis present

## 2017-10-13 DIAGNOSIS — Z3490 Encounter for supervision of normal pregnancy, unspecified, unspecified trimester: Secondary | ICD-10-CM

## 2017-10-13 DIAGNOSIS — Z79899 Other long term (current) drug therapy: Secondary | ICD-10-CM | POA: Diagnosis not present

## 2017-10-13 HISTORY — DX: Unspecified pre-eclampsia, unspecified trimester: O14.90

## 2017-10-13 LAB — PREGNANCY, URINE: Preg Test, Ur: POSITIVE — AB

## 2017-10-13 LAB — URINALYSIS, ROUTINE W REFLEX MICROSCOPIC
Bilirubin Urine: NEGATIVE
GLUCOSE, UA: NEGATIVE mg/dL
HGB URINE DIPSTICK: NEGATIVE
KETONES UR: NEGATIVE mg/dL
Leukocytes, UA: NEGATIVE
Nitrite: NEGATIVE
PROTEIN: NEGATIVE mg/dL
Specific Gravity, Urine: >1.03 — ABNORMAL HIGH (ref 1.005–1.030)
pH: 6 (ref 5.0–8.0)

## 2017-10-13 LAB — WET PREP, GENITAL
Sperm: NONE SEEN
Trich, Wet Prep: NONE SEEN
Yeast Wet Prep HPF POC: NONE SEEN

## 2017-10-13 MED ORDER — METOCLOPRAMIDE HCL 5 MG/ML IJ SOLN
10.0000 mg | Freq: Once | INTRAMUSCULAR | Status: AC
  Administered 2017-10-13: 10 mg via INTRAMUSCULAR
  Filled 2017-10-13: qty 2

## 2017-10-13 NOTE — ED Notes (Signed)
NAD at this time. Pt is stable and going home.  

## 2017-10-13 NOTE — ED Provider Notes (Signed)
[facility] EMERGENCY DEPARTMENT Provider Note   CSN: [id number] Arrival date & time: 10/13/17  0846     History   Chief Complaint Chief Complaint  Patient presents with  . Abdominal Pain  . Emesis    HPI Marisa Mueller is a 21 y.o. female w PMHx preeclampsia, presenting to ED with positive home pregnancy test 2 weeks ago and complaints of intermittent severe lower abdominal pain. Also complains of persistent nausea and vomiting. Pt states the nausea and vomiting is the same as in her previous pregnancy, however is more concerned about the abdominal pain. Reports assoc increased vaginal discharge. States she is unable to see her OB until she is [redacted] weeks pregnant due to her insurance. She denies F/C, vaginal bleeding, HA, or other complaints today.   The history is provided by the patient.    Past Medical History:  Diagnosis Date  . Medical history non-contributory   . Preeclampsia 04/23/2016    There are no active problems to display for this patient.   Past Surgical History:  Procedure Laterality Date  . NO PAST SURGERIES      OB History    Gravida Para Term Preterm AB Living   2 1 1  0 0 1   SAB TAB Ectopic Multiple Live Births   0 0 0 0 1       Home Medications    Prior to Admission medications   Medication Sig Start Date End Date Taking? Authorizing Provider  metoCLOPramide (REGLAN) 10 MG tablet Take 1 tablet (10 mg total) by mouth every 6 (six) hours. 09/08/15   Joycie Peekartner, Benjamin, PA-C    Family History Family History  Problem Relation Age of Onset  . Cancer Paternal Grandmother   . Cancer Paternal Grandfather     Social History Social History   Tobacco Use  . Smoking status: Passive Smoke Exposure - Never Smoker  . Smokeless tobacco: Never Used  Substance Use Topics  . Alcohol use: No  . Drug use: No     Allergies   Patient has no known allergies.   Review of Systems Review of Systems  Constitutional: Negative for fever.    Eyes: Negative for visual disturbance.  Respiratory: Negative for shortness of breath.   Cardiovascular: Negative for chest pain.  Gastrointestinal: Positive for abdominal pain, nausea and vomiting. Negative for constipation and diarrhea.  Genitourinary: Positive for vaginal discharge. Negative for vaginal bleeding.  Neurological: Negative for headaches.  All other systems reviewed and are negative.    Physical Exam Updated Vital Signs BP 109/63 (BP Location: Right Arm)   Pulse (!) 103   Temp 98.4 F (36.9 C) (Oral)   Resp 18   LMP 08/26/2017 (Exact Date)   SpO2 100%   Physical Exam  Constitutional: She appears well-developed and well-nourished.  Non-toxic appearance. She does not appear ill. No distress.  HENT:  Head: Normocephalic and atraumatic.  Eyes: Conjunctivae and EOM are normal. Pupils are equal, round, and reactive to light.  Cardiovascular: Normal rate, regular rhythm, normal heart sounds and intact distal pulses.  Pulmonary/Chest: Effort normal and breath sounds normal.  Abdominal: Soft. Normal appearance and bowel sounds are normal. She exhibits no distension and no mass. There is no tenderness. There is no rigidity and no guarding.  Genitourinary: Cervix exhibits no motion tenderness. Right adnexum displays no mass and no tenderness. Left adnexum displays no mass and no tenderness. No erythema or bleeding in the vagina.  Genitourinary Comments: Exam performed with chaperone present.  White discharge present. Cervical os is closed on exam  Neurological: She is alert.  Skin: Skin is warm.  Psychiatric: She has a normal mood and affect. Her behavior is normal.  Nursing note and vitals reviewed.    ED Treatments / Results  Labs (all labs ordered are listed, but only abnormal results are displayed) Labs Reviewed  WET PREP, GENITAL - Abnormal; Notable for the following components:      Result Value   Clue Cells Wet Prep HPF POC PRESENT (*)    WBC, Wet Prep HPF POC  MANY (*)    All other components within normal limits  URINALYSIS, ROUTINE W REFLEX MICROSCOPIC - Abnormal; Notable for the following components:   Specific Gravity, Urine >1.030 (*)    All other components within normal limits  PREGNANCY, URINE - Abnormal; Notable for the following components:   Preg Test, Ur POSITIVE (*)    All other components within normal limits  HIV ANTIBODY (ROUTINE TESTING)  RPR  GC/CHLAMYDIA PROBE AMP ([place]) NOT AT [facility]    EKG  EKG Interpretation None       Radiology [place] Ob Comp < 14 Wks  Result Date: 10/13/2017 CLINICAL DATA:  Pelvic pain EXAM: OBSTETRIC <14 WK US AND TRANSVAGINAL OB US TECHNIQUE: Both transabdominal and transvaginal ultrasound examinations were performed for complete evaluation of the gestation as well as the maternal uterus, adnexal regions, and pelvic cul-de-sac. Transvaginal technique was performed to assess early pregnancy. COMPARISON:  None. FINDINGS: Intrauterine gestational sac: Visualized Yolk sac:  Visualized Embryo:  Visualized Cardiac Activity: Visualized Heart Rate: 117  bpm CRL:  6  mm   6 w   3 d                  US EDC: June 05, 2018 Subchorionic hemorrhage: There is a 1.5 x 1.0 cm subchorionic hemorrhage. Maternal uterus/adnexae: Cervical os is closed. Maternal ovaries appear normal in size and contour. There is trace free pelvic fluid. IMPRESSION: Single live intrauterine gestation with estimated gestational age of 6+ weeks. Small subchorionic hemorrhage measuring 1.5 x 1.0 cm. Maternal ovaries appear unremarkable bilaterally. Cervical os is closed. Trace free pelvic fluid may be physiologic. Electronically Signed   By: Bretta BangWilliam  Woodruff III M.D.   On: 10/13/2017 10:36   [place] Ob Transvaginal  Result Date: 10/13/2017 CLINICAL DATA:  Pelvic pain EXAM: OBSTETRIC <14 WK US AND TRANSVAGINAL OB US TECHNIQUE: Both transabdominal and transvaginal ultrasound examinations were performed for complete evaluation of the gestation as well  as the maternal uterus, adnexal regions, and pelvic cul-de-sac. Transvaginal technique was performed to assess early pregnancy. COMPARISON:  None. FINDINGS: Intrauterine gestational sac: Visualized Yolk sac:  Visualized Embryo:  Visualized Cardiac Activity: Visualized Heart Rate: 117  bpm CRL:  6  mm   6 w   3 d                  US EDC: June 05, 2018 Subchorionic hemorrhage: There is a 1.5 x 1.0 cm subchorionic hemorrhage. Maternal uterus/adnexae: Cervical os is closed. Maternal ovaries appear normal in size and contour. There is trace free pelvic fluid. IMPRESSION: Single live intrauterine gestation with estimated gestational age of 6+ weeks. Small subchorionic hemorrhage measuring 1.5 x 1.0 cm. Maternal ovaries appear unremarkable bilaterally. Cervical os is closed. Trace free pelvic fluid may be physiologic. Electronically Signed   By: Bretta BangWilliam  Woodruff III M.D.   On: 10/13/2017 10:36    Procedures Procedures (including critical care time)  Medications Ordered in ED Medications  metoCLOPramide (REGLAN) injection 10 mg (10 mg Intramuscular Given 10/13/17 0953)     Initial Impression / Assessment and Plan / ED Course  I have reviewed the triage vital signs and the nursing notes.  Pertinent labs & imaging results that were available during my care of the patient were reviewed by me and considered in my medical decision making (see chart for details).     Presenting with abdominal pain in early pregnancy.  Patient states abdominal pain is severe, however abdominal exam is benign without tenderness, guarding, peritoneal signs.  Patient is well-appearing, not in distress.  OB ultrasound completed showing live intrauterine pregnancy measuring 6 weeks 3 days.  Small subchorionic hemorrhage is also noted.  UA negative for protein or bacteria.  HIV, RPR, gonorrhea, chlamydia pending.  Pelvic exam benign.  Ultrasound results discussed with patient, recommend OB follow up. discussed symptomatic management  for nausea.  Safe for discharge home at this time.  Patient discussed with Dr. Eudelia Bunch.  Discussed results, findings, treatment and follow up. Patient advised of return precautions. Patient verbalized understanding and agreed with plan.  Final Clinical Impressions(s) / ED Diagnoses   Final diagnoses:  Intrauterine pregnancy  Subchorionic hematoma in first trimester, single or unspecified fetus    ED Discharge Orders    None       Robinson, [place] N, PA-C 10/13/17 1114    Nira Conn, MD 10/14/17 [phone]

## 2017-10-13 NOTE — Discharge Instructions (Signed)
Please read instructions below. It is important to take prenatal vitamins daily. Taking them at night may also help your nausea throughout the day. You can take 1/2 tab of over-the-counter Unisom (doxylamine) at bedtime with your prenatal vitamin. Follow up with your OB specialist regarding your visit today, as well as to follow up up on your small subchorionic hemorrhage. Return to the ER for severe abdominal pain, vaginal bleeding or new or concerning symptoms.

## 2017-10-13 NOTE — ED Triage Notes (Signed)
Pt stated that she is pregnant. LMP October but has not been treated for pregnancy.  Pt stated that she has thrown up all week, and slight abdominal pain. Emesis X 2 this AM.

## 2017-10-14 LAB — GC/CHLAMYDIA PROBE AMP (~~LOC~~) NOT AT ARMC
CHLAMYDIA, DNA PROBE: NEGATIVE
Neisseria Gonorrhea: NEGATIVE

## 2017-10-14 LAB — HIV ANTIBODY (ROUTINE TESTING W REFLEX): HIV Screen 4th Generation wRfx: NONREACTIVE

## 2017-10-14 LAB — RPR: RPR: NONREACTIVE

## 2017-11-04 ENCOUNTER — Other Ambulatory Visit: Payer: Self-pay

## 2017-11-04 ENCOUNTER — Emergency Department (HOSPITAL_BASED_OUTPATIENT_CLINIC_OR_DEPARTMENT_OTHER)
Admission: EM | Admit: 2017-11-04 | Discharge: 2017-11-04 | Discharge status: Against Medical Advice | Payer: Medicaid Other

## 2017-11-04 NOTE — ED Triage Notes (Signed)
She was seen [facility] for a rash today and diagnosed with bedbugs. She was told to come here because her BP was low. BP is normal at this time.

## 2018-10-12 ENCOUNTER — Emergency Department (HOSPITAL_BASED_OUTPATIENT_CLINIC_OR_DEPARTMENT_OTHER)
Admission: EM | Admit: 2018-10-12 | Discharge: 2018-10-12 | Disposition: A | Discharge status: Home / Self Care | Payer: 59 | Attending: Emergency Medicine | Admitting: Emergency Medicine

## 2018-10-12 ENCOUNTER — Other Ambulatory Visit: Payer: Self-pay

## 2018-10-12 ENCOUNTER — Encounter (HOSPITAL_BASED_OUTPATIENT_CLINIC_OR_DEPARTMENT_OTHER): Payer: Self-pay

## 2018-10-12 DIAGNOSIS — B9689 Other specified bacterial agents as the cause of diseases classified elsewhere: Secondary | ICD-10-CM

## 2018-10-12 DIAGNOSIS — N760 Acute vaginitis: Secondary | ICD-10-CM | POA: Diagnosis not present

## 2018-10-12 DIAGNOSIS — N898 Other specified noninflammatory disorders of vagina: Secondary | ICD-10-CM | POA: Diagnosis present

## 2018-10-12 LAB — URINALYSIS, ROUTINE W REFLEX MICROSCOPIC
BILIRUBIN URINE: NEGATIVE
GLUCOSE, UA: NEGATIVE mg/dL
HGB URINE DIPSTICK: NEGATIVE
Ketones, ur: NEGATIVE mg/dL
NITRITE: NEGATIVE
PH: 8.5 — AB (ref 5.0–8.0)
Protein, ur: NEGATIVE mg/dL
Specific Gravity, Urine: 1.01 (ref 1.005–1.030)

## 2018-10-12 LAB — URINALYSIS, MICROSCOPIC (REFLEX)

## 2018-10-12 LAB — WET PREP, GENITAL
SPERM: NONE SEEN
Trich, Wet Prep: NONE SEEN
YEAST WET PREP: NONE SEEN

## 2018-10-12 LAB — PREGNANCY, URINE: Preg Test, Ur: NEGATIVE

## 2018-10-12 MED ORDER — METRONIDAZOLE 500 MG PO TABS: 500.0000 mg | ORAL_TABLET | Freq: Two times a day (BID) | ORAL | 0 refills | Status: AC

## 2018-10-12 MED ORDER — DOXYCYCLINE MONOHYDRATE 100 MG PO CAPS: 100.0000 mg | ORAL_CAPSULE | Freq: Two times a day (BID) | ORAL | 0 refills | Status: DC

## 2018-10-12 MED ORDER — DOXYCYCLINE MONOHYDRATE 100 MG PO CAPS: 100.0000 mg | ORAL_CAPSULE | Freq: Two times a day (BID) | ORAL | 0 refills | Status: AC

## 2018-10-12 NOTE — ED Notes (Signed)
ED Provider at bedside. 

## 2018-10-12 NOTE — ED Provider Notes (Signed)
[facility] EMERGENCY DEPARTMENT Provider Note   CSN: [id number] Arrival date & time: 10/12/18  4098     History   Chief Complaint Chief Complaint  Patient presents with  . Vaginal Discharge    HPI Marisa Mueller is a 22 y.o. female.  Patient has a personal history of chlamydia.  This was 1 month ago.  She was hospitalized with this for several days.  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  Patient was discharged home with Keflex which she has with right now..  Does not appear that patient has taken the full course.  Patient also states that her partner received treatment.  Patient has a sexual partner the past 1 month.  States that she uses condoms and mentally.  Patient does not use OCP.  The history is provided by the patient.  Vaginal Discharge   This is a new problem. The current episode started yesterday. The problem has not changed since onset.The discharge occurs while at rest. The discharge was malodorous and yellow. She has missed her period. Associated symptoms include dysuria and perineal pain. Pertinent negatives include no fever, no abdominal pain and no genital itching. She has tried nothing for the symptoms. Her past medical history is significant for STD.    Past Medical History:  Diagnosis Date  . Medical history non-contributory   . Preeclampsia 04/23/2016    There are no active problems to display for this patient.   Past Surgical History:  Procedure Laterality Date  . NO PAST SURGERIES       OB History    Gravida  2   Para  1   Term  1   Preterm  0   AB  0   Living  1     SAB  0   TAB  0   Ectopic  0   Multiple  0   Live Births  1            Home Medications    Prior to Admission medications   Medication Sig Start Date End Date Taking? Authorizing Provider  doxycycline (MONODOX) 100 MG capsule Take 1 capsule (100 mg total) by mouth 2 (two) times daily. 10/12/18   Garnette Gunner, MD  metoCLOPramide  (REGLAN) 10 MG tablet Take 1 tablet (10 mg total) by mouth every 6 (six) hours. 09/08/15   Cartner, Sharlet Salina, PA-C  metroNIDAZOLE (FLAGYL) 500 MG tablet Take 1 tablet (500 mg total) by mouth 2 (two) times daily for 7 days. 10/12/18 10/19/18  Garnette Gunner, MD    Family History Family History  Problem Relation Age of Onset  . Cancer Paternal Grandmother   . Cancer Paternal Grandfather     Social History Social History   Tobacco Use  . Smoking status: Never Smoker  . Smokeless tobacco: Never Used  Substance Use Topics  . Alcohol use: No  . Drug use: No     Allergies   Patient has no known allergies.   Review of Systems Review of Systems  Constitutional: Negative for fever.  Gastrointestinal: Negative for abdominal pain.  Genitourinary: Positive for dysuria and vaginal discharge.     Physical Exam Updated Vital Signs BP 138/77 (BP Location: Right Arm)   Pulse 98   Temp 98.7 F (37.1 C) (Oral)   Resp 16   Ht 5\' 2"  (1.575 m)   Wt 47.6 kg   LMP 09/17/2018   SpO2 100%   Breastfeeding? Unknown   BMI 19.20 kg/m  Physical Exam  Constitutional: She appears well-developed and well-nourished. No distress.  Cardiovascular: Normal rate.  Pulmonary/Chest: Effort normal and breath sounds normal.  Abdominal: Soft. She exhibits no distension. There is no tenderness.  Genitourinary: Vagina normal. There is no rash, tenderness or lesion on the right labia. There is no rash, tenderness or lesion on the left labia. Cervix exhibits discharge. Cervix exhibits no motion tenderness and no friability.  Genitourinary Comments: Bimanual exam normal  Vitals reviewed.  Exam performed with staff nurse as chaperone in the room.  ED Treatments / Results  Labs (all labs ordered are listed, but only abnormal results are displayed) Labs Reviewed  WET PREP, GENITAL - Abnormal; Notable for the following components:      Result Value   Clue Cells Wet Prep HPF POC PRESENT (*)    WBC,  Wet Prep HPF POC MANY (*)    All other components within normal limits  URINALYSIS, ROUTINE W REFLEX MICROSCOPIC - Abnormal; Notable for the following components:   pH 8.5 (*)    Leukocytes, UA SMALL (*)    All other components within normal limits  URINALYSIS, MICROSCOPIC (REFLEX) - Abnormal; Notable for the following components:   Bacteria, UA FEW (*)    All other components within normal limits  PREGNANCY, URINE  GC/CHLAMYDIA PROBE AMP ([place]) NOT AT [facility]    EKG None  Radiology No results found.  Procedures Procedures (including critical care time)  Medications Ordered in ED Medications - No data to display   Initial Impression / Assessment and Plan / ED Course  I have reviewed the triage vital signs and the nursing notes.  Pertinent labs & imaging results that were available during my care of the patient were reviewed by me and considered in my medical decision making (see chart for details).    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  She presented with 1 day of vaginal discharge.  States partner was treated as well.  Intermittent use of barrier protection.  States that she concerns may have missed her period.  Patient had negative HIV/RPR in 10/2017.  UA within normal lungs, urine pregnancy negative, wet prep positive for clue cells.  Unsure of exact course of infection patient may have had.  Will treat patient broadly.  - have her continue take home Keflex Keflex 500 mg 4 times daily - GC/chlamydia, HIV, RPR pending -Additionally add doxycycline to cover for chlamydia -Flagyl for BV -Recommend close follow-up with PCP   Final Clinical Impressions(s) / ED Diagnoses   Final diagnoses:  BV (bacterial vaginosis)  Vaginal discharge    ED Discharge Orders         Ordered    metroNIDAZOLE (FLAGYL) 500 MG tablet  2 times daily     10/12/18 2033    doxycycline (MONODOX) 100 MG capsule  2 times  daily,   Status:  Discontinued     10/12/18 2033    doxycycline (MONODOX) 100 MG capsule  2 times daily     10/12/18 2036           Garnette Gunnerhompson, Shameer Molstad B, MD 10/12/18 2042    Marily MemosMesner, Jason, MD 10/13/18 [phone]

## 2018-10-12 NOTE — ED Triage Notes (Signed)
C/o vaginal d/c x toady-NAD-steady gait-recent tx for gonorrhea with abx-NAD-steady gait

## 2018-10-12 NOTE — Discharge Instructions (Addendum)
Be sure to continue to take your home Keflex 1 tablet (500 mg) every 6 hrs. Also take the additional two antibiotics doxycyline and Metronidazole prescribed.

## 2018-10-13 LAB — GC/CHLAMYDIA PROBE AMP (~~LOC~~) NOT AT ARMC
Chlamydia: NEGATIVE
NEISSERIA GONORRHEA: NEGATIVE

## 2019-12-04 IMAGING — US US OB TRANSVAGINAL
1 series · 14 of 28 positions shown · non-contrast
Comparison: None.

CLINICAL DATA: Pelvic pain

EXAM:
OBSTETRIC <14 WK US AND TRANSVAGINAL OB US
TECHNIQUE: Both transabdominal and transvaginal ultrasound examinations were
performed for complete evaluation of the gestation as well as the
maternal uterus, adnexal regions, and pelvic cul-de-sac.
Transvaginal technique was performed to assess early pregnancy.

[Series 1: us ob transvaginal · 0.17mm/px · 14 of 122 slices shown]
[im 5/122]
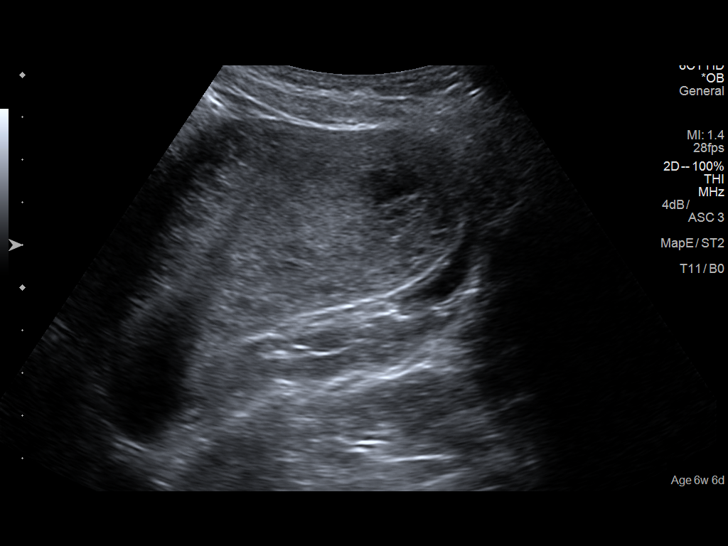
[im 14/122]
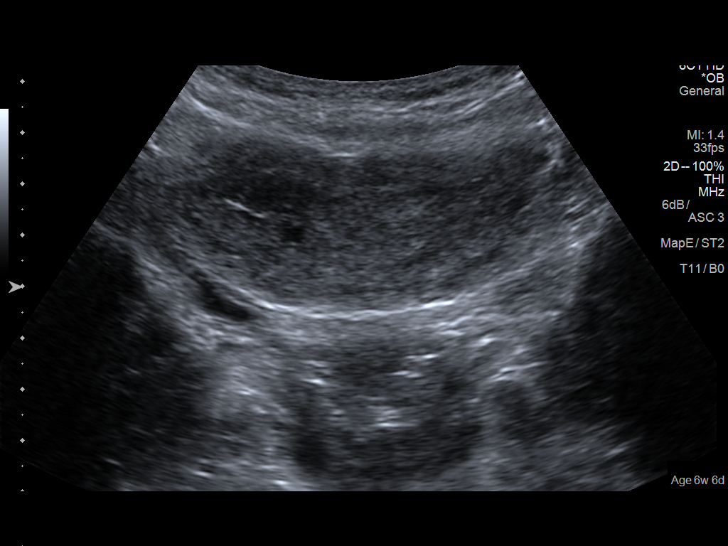
[im 23/122]
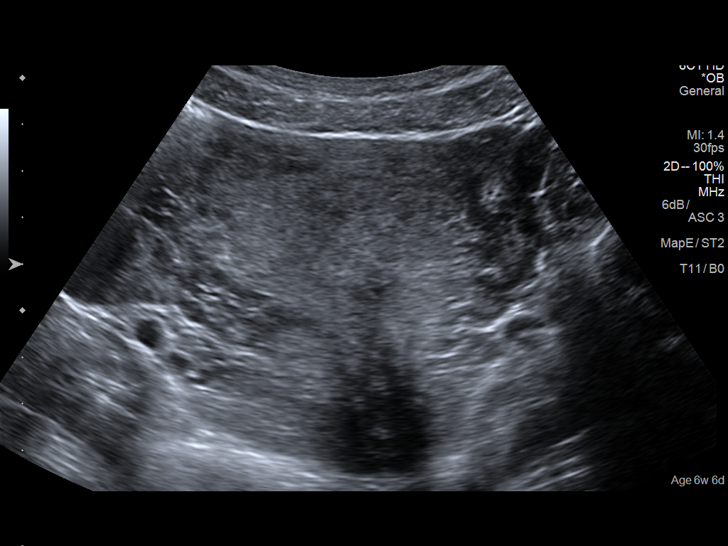
[im 32/122]
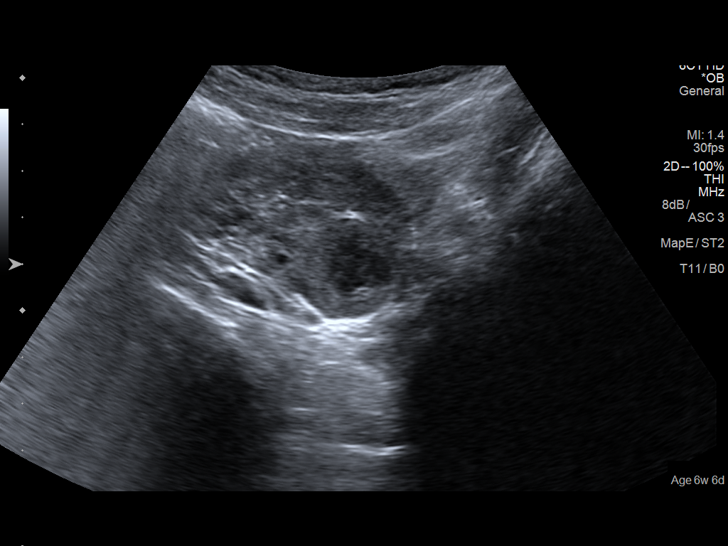
[im 41/122]
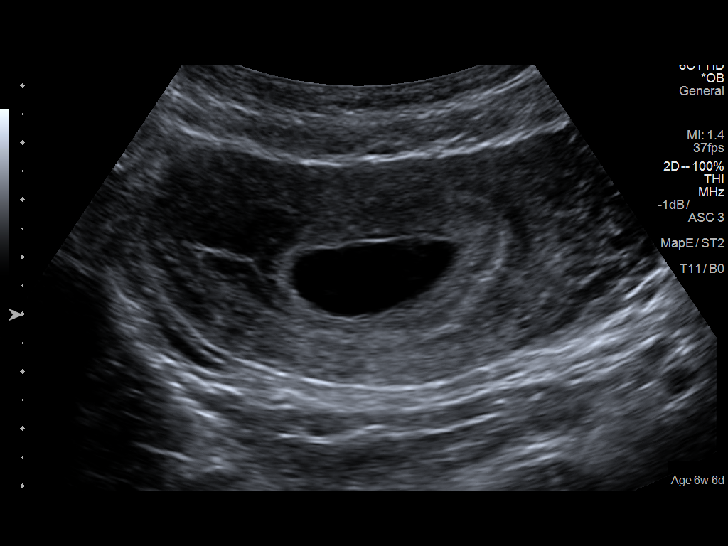
[im 50/122]
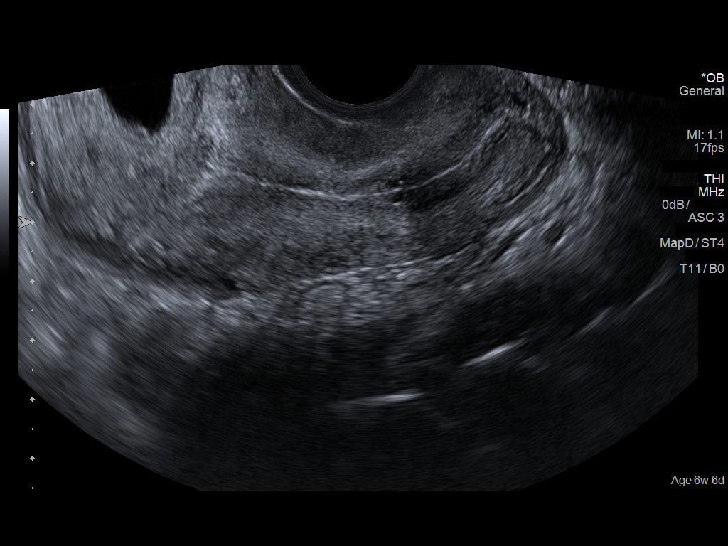
[im 59/122]
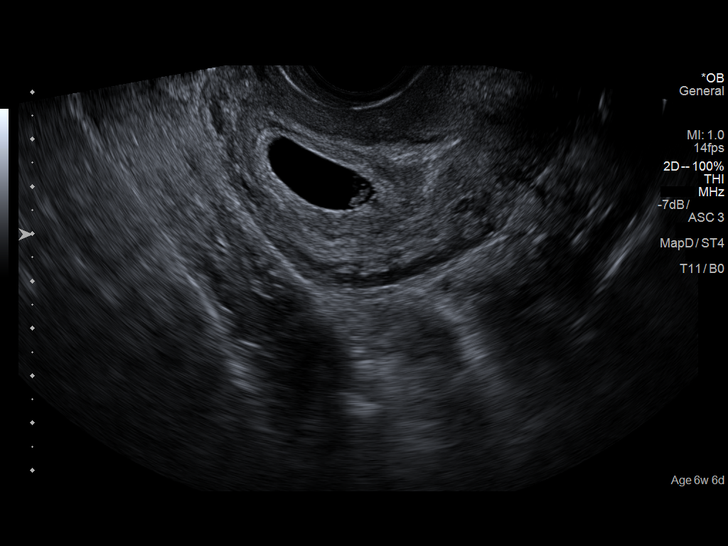
[im 68/122]
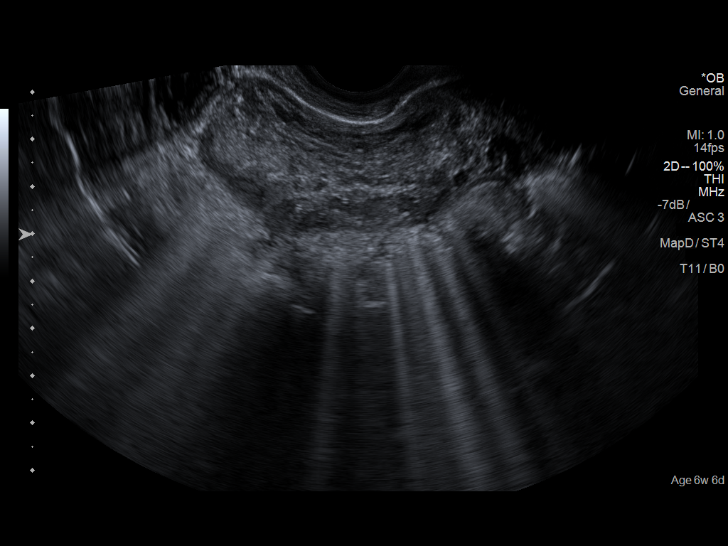
[im 77/122]
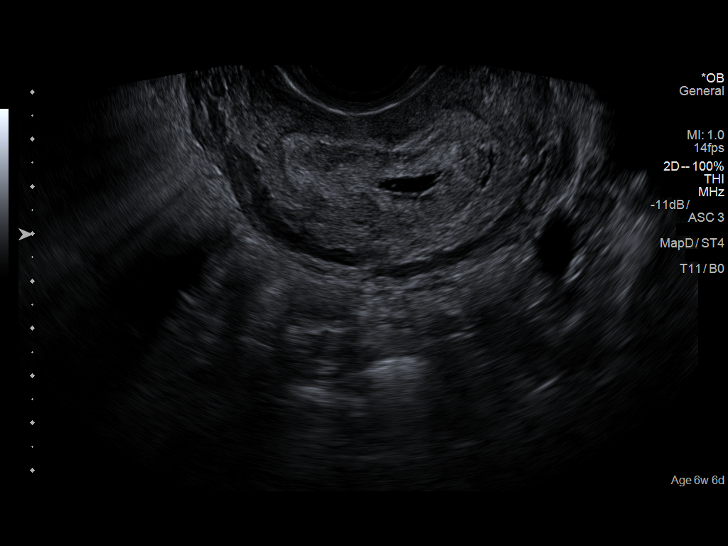
[im 86/122]
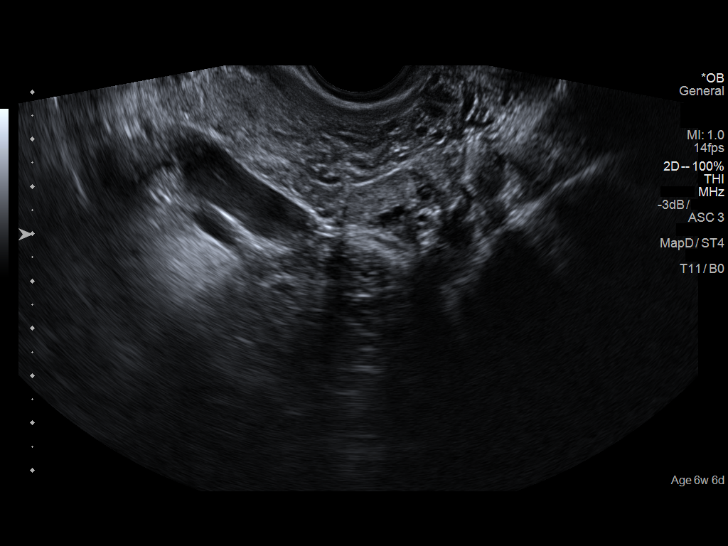
[im 95/122]
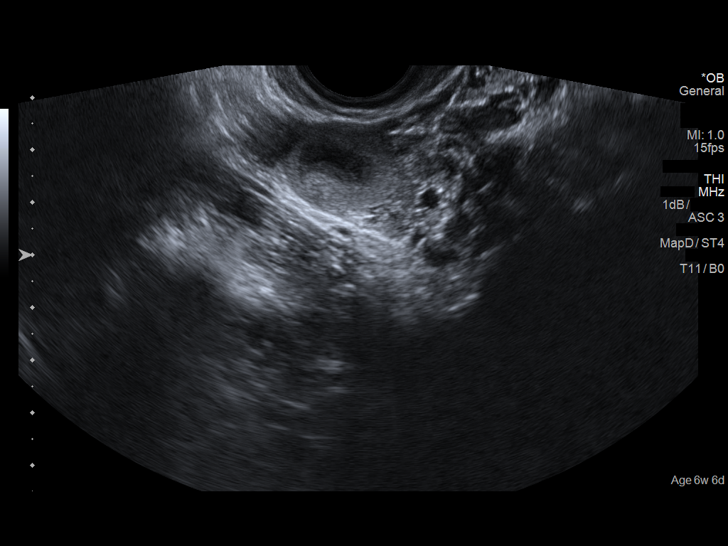
[im 104/122]
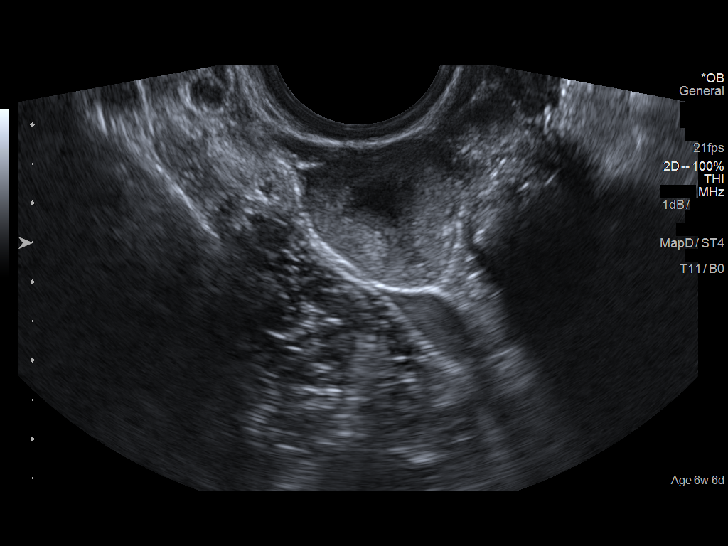
[im 113/122]
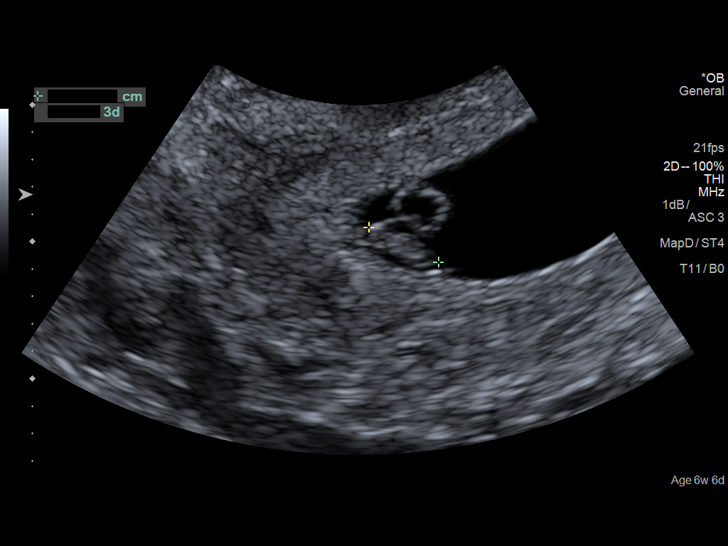
[im 122/122]
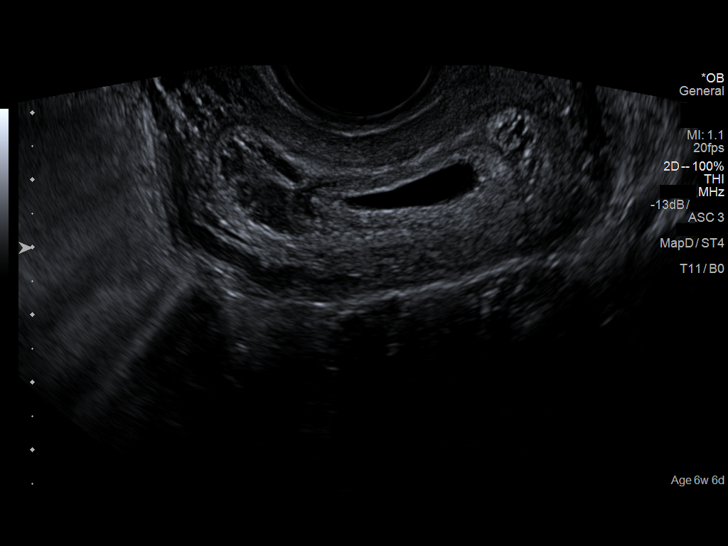

[14 of 28 positions shown; findings below may reference images not displayed]

FINDINGS: Intrauterine gestational sac: Visualized

Yolk sac:  Visualized

Embryo:  Visualized

Cardiac Activity: Visualized

Heart Rate: 117  bpm

CRL:  6  mm   6 w   3 d                  US EDC: June 05, 2018

Subchorionic hemorrhage: There is a 1.5 x 1.0 cm subchorionic
hemorrhage.

Maternal uterus/adnexae: Cervical os is closed. Maternal ovaries
appear normal in size and contour. There is trace free pelvic fluid.
IMPRESSION: Single live intrauterine gestation with estimated gestational age of
6+ weeks. Small subchorionic hemorrhage measuring 1.5 x 1.0 cm.

Maternal ovaries appear unremarkable bilaterally. Cervical os is
closed. Trace free pelvic fluid may be physiologic.
